# Patient Record
Sex: Female | Born: 1970
Health system: Southern US, Community
[De-identification: ages and names within clinical notes are randomized; demographics above are authoritative.]

## PROBLEM LIST (undated history)

## (undated) DIAGNOSIS — C801 Malignant (primary) neoplasm, unspecified: Secondary | ICD-10-CM

## (undated) HISTORY — PX: TONSILLECTOMY: SUR1361

---

## 2016-04-16 DIAGNOSIS — L539 Erythematous condition, unspecified: Secondary | ICD-10-CM | POA: Diagnosis not present

## 2016-05-29 DIAGNOSIS — Z0000 Encounter for general adult medical examination without abnormal findings: Secondary | ICD-10-CM | POA: Diagnosis not present

## 2016-05-30 DIAGNOSIS — Z0000 Encounter for general adult medical examination without abnormal findings: Secondary | ICD-10-CM | POA: Diagnosis not present

## 2016-06-13 DIAGNOSIS — J029 Acute pharyngitis, unspecified: Secondary | ICD-10-CM | POA: Diagnosis not present

## 2016-09-30 DIAGNOSIS — E7800 Pure hypercholesterolemia, unspecified: Secondary | ICD-10-CM | POA: Diagnosis not present

## 2016-12-03 ENCOUNTER — Other Ambulatory Visit: Payer: Self-pay | Admitting: Family Medicine

## 2016-12-03 DIAGNOSIS — Z01419 Encounter for gynecological examination (general) (routine) without abnormal findings: Secondary | ICD-10-CM | POA: Diagnosis not present

## 2016-12-03 DIAGNOSIS — Z1231 Encounter for screening mammogram for malignant neoplasm of breast: Secondary | ICD-10-CM | POA: Diagnosis not present

## 2016-12-17 ENCOUNTER — Ambulatory Visit
Admission: RE | Admit: 2016-12-17 | Discharge: 2016-12-17 | Disposition: A | Discharge status: Home / Self Care | Payer: 59 | Source: Ambulatory Visit | Attending: Family Medicine | Admitting: Family Medicine

## 2016-12-17 ENCOUNTER — Other Ambulatory Visit: Payer: Self-pay | Admitting: Family Medicine

## 2016-12-17 DIAGNOSIS — Z1231 Encounter for screening mammogram for malignant neoplasm of breast: Secondary | ICD-10-CM

## 2016-12-19 DIAGNOSIS — E7800 Pure hypercholesterolemia, unspecified: Secondary | ICD-10-CM | POA: Diagnosis not present

## 2016-12-19 DIAGNOSIS — J069 Acute upper respiratory infection, unspecified: Secondary | ICD-10-CM | POA: Diagnosis not present

## 2017-04-21 DIAGNOSIS — E7800 Pure hypercholesterolemia, unspecified: Secondary | ICD-10-CM | POA: Diagnosis not present

## 2017-06-10 DIAGNOSIS — Z0000 Encounter for general adult medical examination without abnormal findings: Secondary | ICD-10-CM | POA: Diagnosis not present

## 2017-12-04 ENCOUNTER — Other Ambulatory Visit: Payer: Self-pay | Admitting: Obstetrics & Gynecology

## 2017-12-04 DIAGNOSIS — Z0000 Encounter for general adult medical examination without abnormal findings: Secondary | ICD-10-CM | POA: Diagnosis not present

## 2017-12-04 DIAGNOSIS — Z01419 Encounter for gynecological examination (general) (routine) without abnormal findings: Secondary | ICD-10-CM | POA: Diagnosis not present

## 2017-12-04 DIAGNOSIS — Z1231 Encounter for screening mammogram for malignant neoplasm of breast: Secondary | ICD-10-CM

## 2017-12-11 DIAGNOSIS — E7800 Pure hypercholesterolemia, unspecified: Secondary | ICD-10-CM | POA: Diagnosis not present

## 2017-12-11 DIAGNOSIS — M7521 Bicipital tendinitis, right shoulder: Secondary | ICD-10-CM | POA: Diagnosis not present

## 2017-12-18 ENCOUNTER — Ambulatory Visit
Admission: RE | Admit: 2017-12-18 | Discharge: 2017-12-18 | Disposition: A | Discharge status: Home / Self Care | Payer: 59 | Source: Ambulatory Visit | Attending: Obstetrics & Gynecology | Admitting: Obstetrics & Gynecology

## 2017-12-18 DIAGNOSIS — Z1231 Encounter for screening mammogram for malignant neoplasm of breast: Secondary | ICD-10-CM | POA: Diagnosis present

## 2017-12-18 HISTORY — DX: Malignant (primary) neoplasm, unspecified: C80.1

## 2018-01-21 DIAGNOSIS — R3129 Other microscopic hematuria: Secondary | ICD-10-CM | POA: Diagnosis not present

## 2018-01-21 DIAGNOSIS — M545 Low back pain: Secondary | ICD-10-CM | POA: Diagnosis not present

## 2018-01-21 DIAGNOSIS — R300 Dysuria: Secondary | ICD-10-CM | POA: Diagnosis not present

## 2018-01-21 DIAGNOSIS — R350 Frequency of micturition: Secondary | ICD-10-CM | POA: Diagnosis not present

## 2018-02-12 DIAGNOSIS — H524 Presbyopia: Secondary | ICD-10-CM | POA: Diagnosis not present

## 2018-04-13 DIAGNOSIS — J029 Acute pharyngitis, unspecified: Secondary | ICD-10-CM | POA: Diagnosis not present

## 2018-04-13 DIAGNOSIS — B373 Candidiasis of vulva and vagina: Secondary | ICD-10-CM | POA: Diagnosis not present

## 2018-04-13 DIAGNOSIS — R6889 Other general symptoms and signs: Secondary | ICD-10-CM | POA: Diagnosis not present

## 2018-04-13 DIAGNOSIS — J209 Acute bronchitis, unspecified: Secondary | ICD-10-CM | POA: Diagnosis not present

## 2018-04-13 DIAGNOSIS — J019 Acute sinusitis, unspecified: Secondary | ICD-10-CM | POA: Diagnosis not present

## 2018-04-15 DIAGNOSIS — E7800 Pure hypercholesterolemia, unspecified: Secondary | ICD-10-CM | POA: Diagnosis not present

## 2018-04-16 DIAGNOSIS — J181 Lobar pneumonia, unspecified organism: Secondary | ICD-10-CM | POA: Diagnosis not present

## 2018-04-22 DIAGNOSIS — J019 Acute sinusitis, unspecified: Secondary | ICD-10-CM | POA: Diagnosis not present

## 2018-04-22 DIAGNOSIS — E7800 Pure hypercholesterolemia, unspecified: Secondary | ICD-10-CM | POA: Diagnosis not present

## 2018-04-22 DIAGNOSIS — E6609 Other obesity due to excess calories: Secondary | ICD-10-CM | POA: Diagnosis not present

## 2018-05-03 DIAGNOSIS — R05 Cough: Secondary | ICD-10-CM | POA: Diagnosis not present

## 2018-05-03 DIAGNOSIS — J40 Bronchitis, not specified as acute or chronic: Secondary | ICD-10-CM | POA: Diagnosis not present

## 2018-05-10 DIAGNOSIS — J209 Acute bronchitis, unspecified: Secondary | ICD-10-CM | POA: Diagnosis not present

## 2018-05-10 DIAGNOSIS — J45998 Other asthma: Secondary | ICD-10-CM | POA: Diagnosis not present

## 2018-05-10 DIAGNOSIS — J019 Acute sinusitis, unspecified: Secondary | ICD-10-CM | POA: Diagnosis not present

## 2018-05-27 DIAGNOSIS — Z23 Encounter for immunization: Secondary | ICD-10-CM | POA: Diagnosis not present

## 2018-06-04 DIAGNOSIS — D2262 Melanocytic nevi of left upper limb, including shoulder: Secondary | ICD-10-CM | POA: Diagnosis not present

## 2018-06-04 DIAGNOSIS — Z85820 Personal history of malignant melanoma of skin: Secondary | ICD-10-CM | POA: Diagnosis not present

## 2018-06-04 DIAGNOSIS — D2261 Melanocytic nevi of right upper limb, including shoulder: Secondary | ICD-10-CM | POA: Diagnosis not present

## 2018-06-17 DIAGNOSIS — J019 Acute sinusitis, unspecified: Secondary | ICD-10-CM | POA: Diagnosis not present

## 2018-06-17 DIAGNOSIS — B9689 Other specified bacterial agents as the cause of diseases classified elsewhere: Secondary | ICD-10-CM | POA: Diagnosis not present

## 2018-08-20 DIAGNOSIS — Z0000 Encounter for general adult medical examination without abnormal findings: Secondary | ICD-10-CM | POA: Diagnosis not present

## 2018-08-20 DIAGNOSIS — E7800 Pure hypercholesterolemia, unspecified: Secondary | ICD-10-CM | POA: Diagnosis not present

## 2018-08-27 DIAGNOSIS — E7800 Pure hypercholesterolemia, unspecified: Secondary | ICD-10-CM | POA: Diagnosis not present

## 2018-08-27 DIAGNOSIS — R030 Elevated blood-pressure reading, without diagnosis of hypertension: Secondary | ICD-10-CM | POA: Diagnosis not present

## 2018-08-27 DIAGNOSIS — Z0000 Encounter for general adult medical examination without abnormal findings: Secondary | ICD-10-CM | POA: Diagnosis not present

## 2019-02-10 ENCOUNTER — Other Ambulatory Visit: Payer: Self-pay

## 2019-02-10 ENCOUNTER — Encounter: Payer: Self-pay | Admitting: Emergency Medicine

## 2019-02-10 ENCOUNTER — Emergency Department: Payer: 59

## 2019-02-10 DIAGNOSIS — T50905A Adverse effect of unspecified drugs, medicaments and biological substances, initial encounter: Secondary | ICD-10-CM | POA: Diagnosis not present

## 2019-02-10 DIAGNOSIS — R519 Headache, unspecified: Secondary | ICD-10-CM | POA: Diagnosis present

## 2019-02-10 DIAGNOSIS — R232 Flushing: Secondary | ICD-10-CM | POA: Diagnosis not present

## 2019-02-10 DIAGNOSIS — T887XXA Unspecified adverse effect of drug or medicament, initial encounter: Secondary | ICD-10-CM | POA: Diagnosis not present

## 2019-02-10 DIAGNOSIS — J0100 Acute maxillary sinusitis, unspecified: Secondary | ICD-10-CM | POA: Insufficient documentation

## 2019-02-10 DIAGNOSIS — Y658 Other specified misadventures during surgical and medical care: Secondary | ICD-10-CM | POA: Insufficient documentation

## 2019-02-10 LAB — URINALYSIS, COMPLETE (UACMP) WITH MICROSCOPIC
Bacteria, UA: NONE SEEN
Bilirubin Urine: NEGATIVE
Glucose, UA: NEGATIVE mg/dL
Ketones, ur: NEGATIVE mg/dL
Leukocytes,Ua: NEGATIVE
Nitrite: NEGATIVE
Protein, ur: NEGATIVE mg/dL
Specific Gravity, Urine: 1.012 (ref 1.005–1.030)
pH: 6 (ref 5.0–8.0)

## 2019-02-10 LAB — CBC WITH DIFFERENTIAL/PLATELET
Abs Immature Granulocytes: 0.04 10*3/uL (ref 0.00–0.07)
Basophils Absolute: 0.1 10*3/uL (ref 0.0–0.1)
Basophils Relative: 1 %
Eosinophils Absolute: 0.2 10*3/uL (ref 0.0–0.5)
Eosinophils Relative: 1 %
HCT: 44.6 % (ref 36.0–46.0)
Hemoglobin: 15.2 g/dL — ABNORMAL HIGH (ref 12.0–15.0)
Immature Granulocytes: 0 %
Lymphocytes Relative: 18 %
Lymphs Abs: 2.2 10*3/uL (ref 0.7–4.0)
MCH: 30.5 pg (ref 26.0–34.0)
MCHC: 34.1 g/dL (ref 30.0–36.0)
MCV: 89.6 fL (ref 80.0–100.0)
Monocytes Absolute: 0.9 10*3/uL (ref 0.1–1.0)
Monocytes Relative: 7 %
Neutro Abs: 9.3 10*3/uL — ABNORMAL HIGH (ref 1.7–7.7)
Neutrophils Relative %: 73 %
Platelets: 313 10*3/uL (ref 150–400)
RBC: 4.98 MIL/uL (ref 3.87–5.11)
RDW: 11.9 % (ref 11.5–15.5)
WBC: 12.7 10*3/uL — ABNORMAL HIGH (ref 4.0–10.5)
nRBC: 0 % (ref 0.0–0.2)

## 2019-02-10 LAB — COMPREHENSIVE METABOLIC PANEL WITH GFR
ALT: 32 U/L (ref 0–44)
AST: 30 U/L (ref 15–41)
Albumin: 4.7 g/dL (ref 3.5–5.0)
Alkaline Phosphatase: 73 U/L (ref 38–126)
Anion gap: 11 (ref 5–15)
BUN: 11 mg/dL (ref 6–20)
CO2: 24 mmol/L (ref 22–32)
Calcium: 9.3 mg/dL (ref 8.9–10.3)
Chloride: 103 mmol/L (ref 98–111)
Creatinine, Ser: 0.9 mg/dL (ref 0.44–1.00)
GFR calc Af Amer: >60 mL/min (ref >60)
GFR calc non Af Amer: >60 mL/min (ref >60)
Glucose, Bld: 150 mg/dL — ABNORMAL HIGH (ref 70–99)
Potassium: 3.2 mmol/L — ABNORMAL LOW (ref 3.5–5.1)
Sodium: 138 mmol/L (ref 135–145)
Total Bilirubin: 0.7 mg/dL (ref 0.3–1.2)
Total Protein: 8.3 g/dL — ABNORMAL HIGH (ref 6.5–8.1)

## 2019-02-10 LAB — LIPASE, BLOOD: Lipase: 31 U/L (ref 11–51)

## 2019-02-10 LAB — POCT PREGNANCY, URINE: Preg Test, Ur: NEGATIVE

## 2019-02-10 NOTE — ED Triage Notes (Addendum)
Patient ambulatory to triage with steady gait, without difficulty or distress noted, mask in place; pt st "I felt a warm sensation come over my body like my skin is hot with nausea"; denies pain or other accomp symptoms; st has recently been taking mucinex for sinus pressure and congestion

## 2019-02-11 ENCOUNTER — Emergency Department
Admission: EM | Admit: 2019-02-11 | Discharge: 2019-02-11 | Disposition: A | Discharge status: Home / Self Care | Payer: 59 | Attending: Emergency Medicine | Admitting: Emergency Medicine

## 2019-02-11 DIAGNOSIS — J0100 Acute maxillary sinusitis, unspecified: Secondary | ICD-10-CM

## 2019-02-11 DIAGNOSIS — T50905A Adverse effect of unspecified drugs, medicaments and biological substances, initial encounter: Secondary | ICD-10-CM

## 2019-02-11 MED ORDER — AMOXICILLIN-POT CLAVULANATE 875-125 MG PO TABS: 1.0000 | ORAL_TABLET | Freq: Two times a day (BID) | ORAL | 0 refills | Status: AC

## 2019-02-11 MED ORDER — DIPHENHYDRAMINE HCL 25 MG PO CAPS
50.0000 mg | ORAL_CAPSULE | Freq: Once | ORAL | Status: AC
  Administered 2019-02-11: 50 mg via ORAL

## 2019-02-11 MED ORDER — DIPHENHYDRAMINE HCL 25 MG PO CAPS
ORAL_CAPSULE | ORAL | Status: AC
  Filled 2019-02-11: qty 2

## 2019-02-11 NOTE — ED Provider Notes (Signed)
[facility] Emergency Department Provider Note    First MD Initiated Contact with Patient 02/11/19 [phone]     (approximate)  I have reviewed the triage vital signs and the nursing notes.   HISTORY  Chief Complaint Nausea   HPI Whitney Schmitt is a 48 y.o. female with below list of previous medical conditions presents to the emergency department secondary to warm sensation all over her body, skin felt hot and subsequent nausea after taking Xyzal.  Patient was seen by her primary care provider and advised to take Xyzal and Mucinex Zyrtec and Flonase secondary to allergies.  Patient states since her appointment she has had bilateral facial pain upper tooth pain.  Patient denies any fever afebrile on presentation.  Patient states that symptoms have been occurring since last Thursday.       Past Medical History:  Diagnosis Date  . Cancer ([place])    skin    There are no active problems to display for this patient.   Past Surgical History:  Procedure Laterality Date  . TONSILLECTOMY      Prior to Admission medications   Medication Sig Start Date End Date Taking? Authorizing Provider  amoxicillin-clavulanate (AUGMENTIN) 875-125 MG tablet Take 1 tablet by mouth 2 (two) times daily for 10 days. 02/11/19 02/21/19  Gregor Hams, MD    Allergies Patient has no known allergies.  No family history on file.  Social History Social History   Tobacco Use  . Smoking status: Never Smoker  . Smokeless tobacco: Never Used  Substance Use Topics  . Alcohol use: Not on file  . Drug use: Not on file    Review of Systems Constitutional: No fever/chills Eyes: No visual changes. ENT: No sore throat.  Positive for facial pain Cardiovascular: Denies chest pain. Respiratory: Denies shortness of breath. Gastrointestinal: No abdominal pain.  No nausea, no vomiting.  No diarrhea.  No constipation. Genitourinary: Negative for dysuria. Musculoskeletal: Negative for  neck pain.  Negative for back pain. Integumentary: Negative for rash.  Positive for hot sensation to the skin Neurological: Negative for headaches, focal weakness or numbness.   ____________________________________________   PHYSICAL EXAM:  VITAL SIGNS: ED Triage Vitals [02/10/19 2022]  Enc Vitals Group     BP (!) 169/100     Pulse Rate (!) 118     Resp 20     Temp 97.8 F (36.6 C)     Temp Source Oral     SpO2 100 %     Weight 102.1 kg (225 lb)     Height 1.727 m (5\' 8" )     Head Circumference      Peak Flow      Pain Score 0     Pain Loc      Pain Edu?      Excl. in [place]?     Constitutional: Alert and oriented.  Eyes: Conjunctivae are normal.  Mouth/Throat: Patient is wearing a mask. Neck: No stridor.  No meningeal signs.   Cardiovascular: Normal rate, regular rhythm. Good peripheral circulation. Grossly normal heart sounds. Respiratory: Normal respiratory effort.  No retractions. Gastrointestinal: Soft and nontender. No distention.  Musculoskeletal: No lower extremity tenderness nor edema. No gross deformities of extremities. Neurologic:  Normal speech and language. No gross focal neurologic deficits are appreciated.  Skin:  Skin is warm, dry and intact.  Flushing of the thoracic and skin on the neck.   Psychiatric: Mood and affect are normal. Speech and behavior are normal.  ____________________________________________   LABS (all labs ordered are listed, but only abnormal results are displayed)  Labs Reviewed  CBC WITH DIFFERENTIAL/PLATELET - Abnormal; Notable for the following components:      Result Value   WBC 12.7 (*)    Hemoglobin 15.2 (*)    Neutro Abs 9.3 (*)    All other components within normal limits  COMPREHENSIVE METABOLIC PANEL - Abnormal; Notable for the following components:   Potassium 3.2 (*)    Glucose, Bld 150 (*)    Total Protein 8.3 (*)    All other components within normal limits  URINALYSIS, COMPLETE (UACMP) WITH MICROSCOPIC -  Abnormal; Notable for the following components:   Color, Urine YELLOW (*)    APPearance CLEAR (*)    Hgb urine dipstick SMALL (*)    All other components within normal limits  LIPASE, BLOOD  POCT PREGNANCY, URINE    RADIOLOGY I, [place] Ronny Korff, personally viewed and evaluated these images (plain radiographs) as part of my medical decision making, as well as reviewing the written report by the radiologist.  ED MD interpretation: No acute cardiopulmonary abnormality.  Official radiology report(s): Dg Chest 2 View  Result Date: 02/10/2019 CLINICAL DATA:  Congestion EXAM: CHEST - 2 VIEW COMPARISON:  None FINDINGS: No consolidation, features of edema, pneumothorax, or effusion. Pulmonary vascularity is normally distributed. The cardiomediastinal contours are unremarkable. No acute osseous or soft tissue abnormality. IMPRESSION: No acute cardiopulmonary abnormality. Electronically Signed   By: Lovena Le M.D.   On: 02/10/2019 23:57     Procedures   ____________________________________________   INITIAL IMPRESSION / MDM / [place] / ED COURSE  As part of my medical decision making, I reviewed the following data within the electronic MEDICAL RECORD NUMBER  48 year old female presenting with above-stated history and physical exam concerning for possible adverse reaction to Xyzal and sinusitis.  Patient given Benadryl 50 mg in the emergency department improvement of symptoms.  Patient will also be prescribed Augmentin for home with recommendation to follow-up with primary care provider.  ____________________________________________  FINAL CLINICAL IMPRESSION(S) / ED DIAGNOSES  Final diagnoses:  Acute maxillary sinusitis, recurrence not specified  Medication reaction, initial encounter     MEDICATIONS GIVEN DURING THIS VISIT:  Medications  diphenhydrAMINE (BENADRYL) capsule 50 mg (50 mg Oral Given 02/11/19 0113)     ED Discharge Orders         Ordered     amoxicillin-clavulanate (AUGMENTIN) 875-125 MG tablet  2 times daily     02/11/19 0149          *Please note:  Whitney Schmitt was evaluated in Emergency Department on 02/11/2019 for the symptoms described in the history of present illness. She was evaluated in the context of the global COVID-19 pandemic, which necessitated consideration that the patient might be at risk for infection with the SARS-CoV-2 virus that causes COVID-19. Institutional protocols and algorithms that pertain to the evaluation of patients at risk for COVID-19 are in a state of rapid change based on information released by regulatory bodies including the CDC and federal and state organizations. These policies and algorithms were followed during the patient's care in the ED.  Some ED evaluations and interventions may be delayed as a result of limited staffing during the pandemic.*  Note:  This document was prepared using Dragon voice recognition software and may include unintentional dictation errors.   Gregor Hams, MD 02/11/19 [phone]

## 2019-09-15 ENCOUNTER — Other Ambulatory Visit: Payer: Self-pay | Admitting: Obstetrics & Gynecology

## 2019-09-15 DIAGNOSIS — Z1231 Encounter for screening mammogram for malignant neoplasm of breast: Secondary | ICD-10-CM

## 2019-10-19 ENCOUNTER — Ambulatory Visit
Admission: RE | Admit: 2019-10-19 | Discharge: 2019-10-19 | Disposition: A | Discharge status: Home / Self Care | Payer: 59 | Source: Ambulatory Visit | Attending: Obstetrics & Gynecology | Admitting: Obstetrics & Gynecology

## 2019-10-19 ENCOUNTER — Encounter: Payer: Self-pay | Admitting: Radiology

## 2019-10-19 DIAGNOSIS — Z1231 Encounter for screening mammogram for malignant neoplasm of breast: Secondary | ICD-10-CM | POA: Insufficient documentation

## 2020-06-07 ENCOUNTER — Ambulatory Visit: Admit: 2020-06-07 | Payer: 59 | Admitting: Unknown Physician Specialty

## 2020-06-07 SURGERY — SINUS SURGERY, WITH IMAGING GUIDANCE
Anesthesia: General | Laterality: Bilateral

## 2020-09-14 ENCOUNTER — Other Ambulatory Visit: Payer: Self-pay | Admitting: Family Medicine

## 2020-09-14 DIAGNOSIS — Z1231 Encounter for screening mammogram for malignant neoplasm of breast: Secondary | ICD-10-CM

## 2020-10-19 ENCOUNTER — Ambulatory Visit
Admission: RE | Admit: 2020-10-19 | Discharge: 2020-10-19 | Disposition: A | Discharge status: Home / Self Care | Payer: 59 | Source: Ambulatory Visit | Attending: Family Medicine | Admitting: Family Medicine

## 2020-10-19 ENCOUNTER — Other Ambulatory Visit: Payer: Self-pay

## 2020-10-19 DIAGNOSIS — Z1231 Encounter for screening mammogram for malignant neoplasm of breast: Secondary | ICD-10-CM | POA: Diagnosis not present

## 2021-09-10 ENCOUNTER — Other Ambulatory Visit: Payer: Self-pay | Admitting: Certified Nurse Midwife

## 2021-09-10 DIAGNOSIS — Z1231 Encounter for screening mammogram for malignant neoplasm of breast: Secondary | ICD-10-CM

## 2021-10-29 ENCOUNTER — Ambulatory Visit
Admission: RE | Admit: 2021-10-29 | Discharge: 2021-10-29 | Disposition: A | Discharge status: Home / Self Care | Payer: 59 | Source: Ambulatory Visit | Attending: Certified Nurse Midwife | Admitting: Certified Nurse Midwife

## 2021-10-29 DIAGNOSIS — Z1231 Encounter for screening mammogram for malignant neoplasm of breast: Secondary | ICD-10-CM | POA: Insufficient documentation

## 2022-09-19 ENCOUNTER — Other Ambulatory Visit: Payer: Self-pay

## 2022-09-19 DIAGNOSIS — Z1231 Encounter for screening mammogram for malignant neoplasm of breast: Secondary | ICD-10-CM

## 2022-09-30 IMAGING — MG MM DIGITAL SCREENING BILAT W/ TOMO AND CAD
8 of 14 series · 8 of 40 positions shown · non-contrast
Comparison: Previous exam(s).

CLINICAL DATA: Screening.

EXAM:
DIGITAL SCREENING BILATERAL MAMMOGRAM WITH TOMOSYNTHESIS AND CAD
TECHNIQUE: Bilateral screening digital craniocaudal and mediolateral oblique
mammograms were obtained. Bilateral screening digital breast
tomosynthesis was performed. The images were evaluated with
computer-aided detection.

[L MLO synth-2D (1 of 2)]
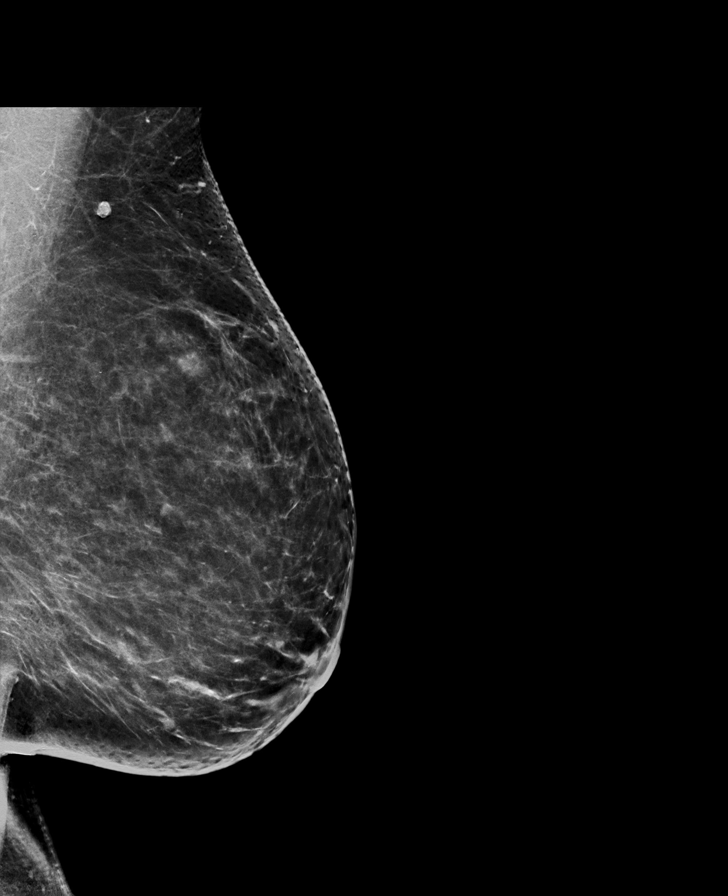

[R MLO synth-2D]
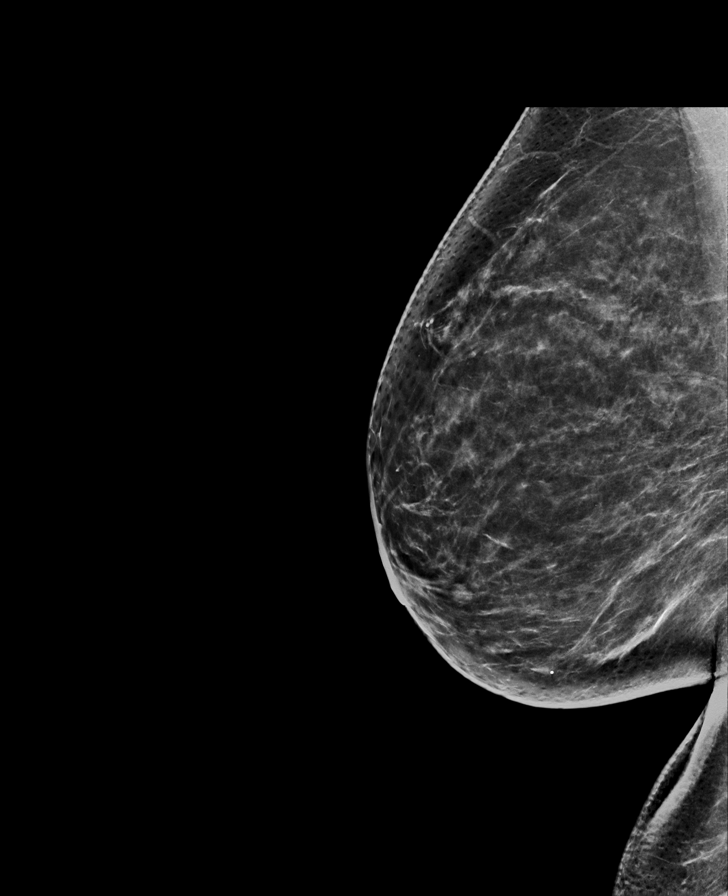

[L CC synth-2D (1 of 2)]
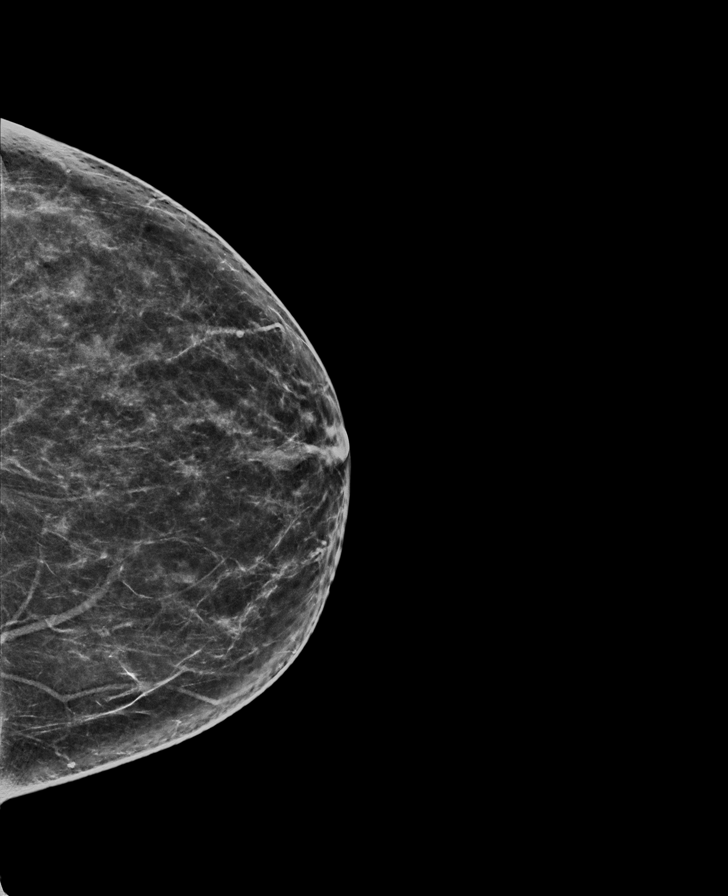

[L MLO synth-2D (2 of 2)]
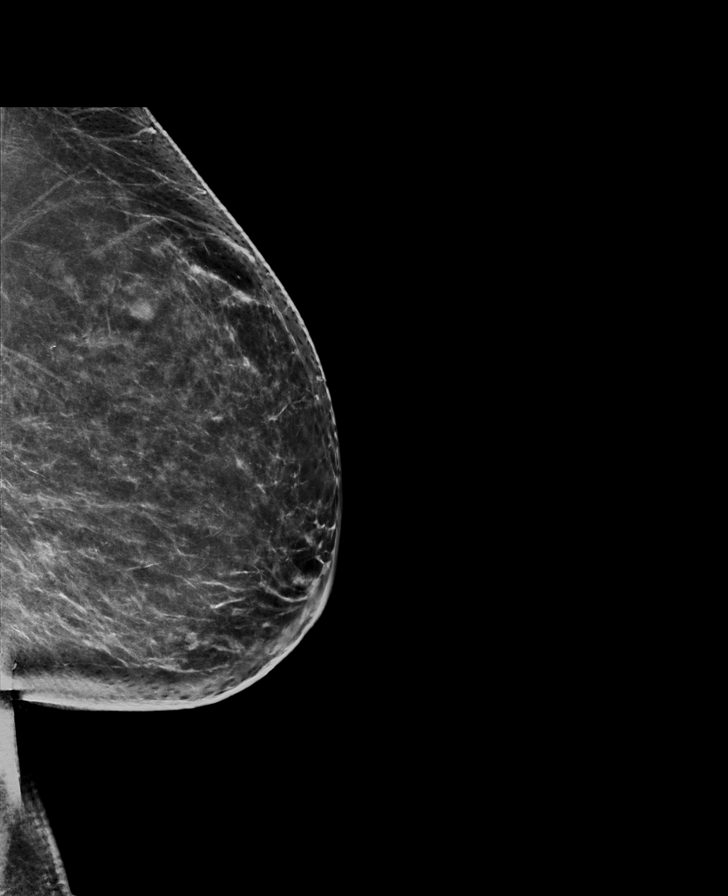

[R CC synth-2D (1 of 2)]
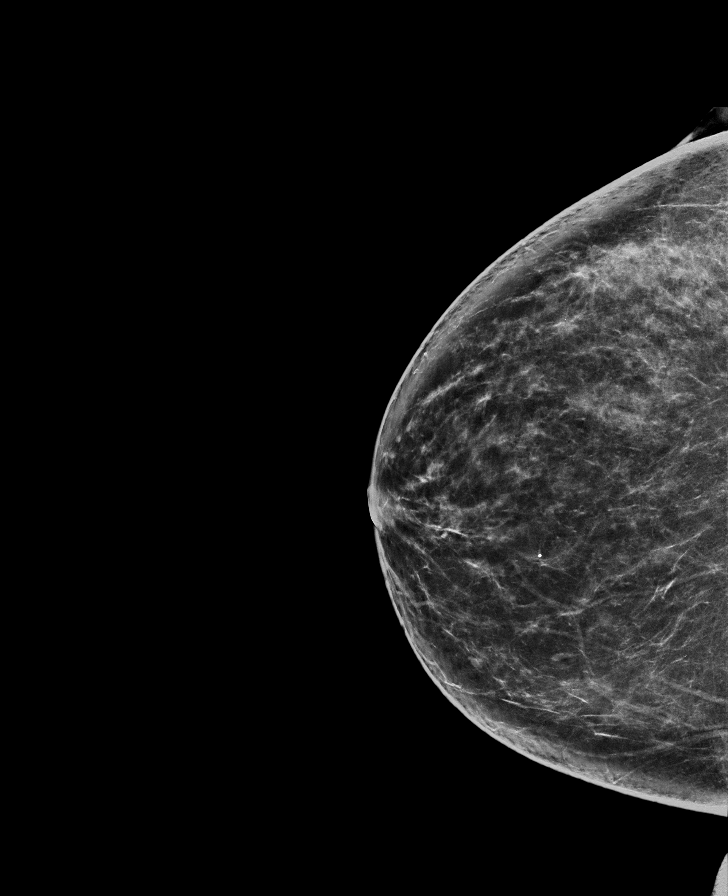

[R CC synth-2D (2 of 2)]
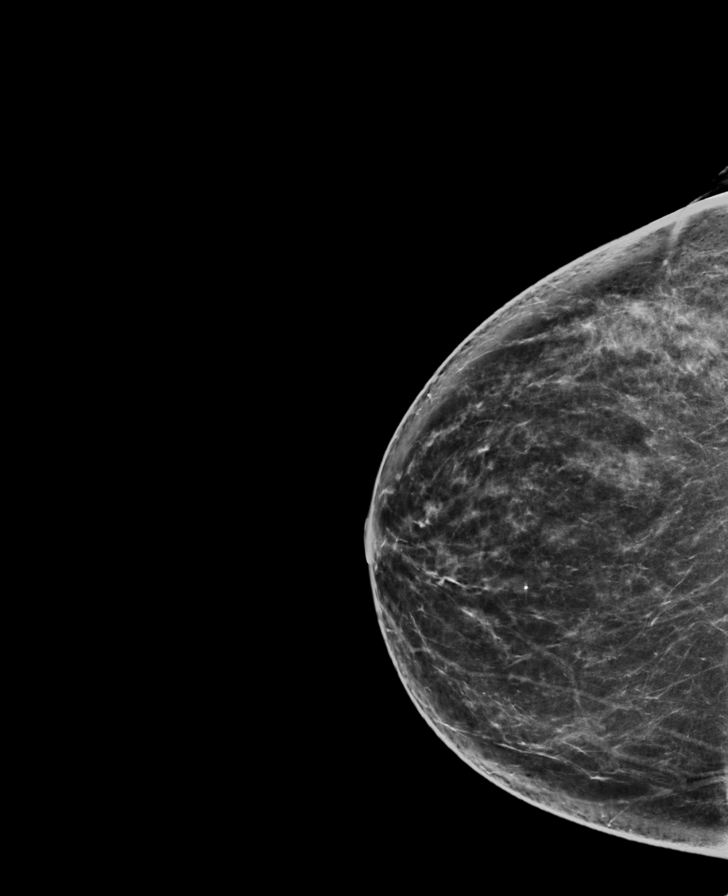

[L CC synth-2D (2 of 2)]
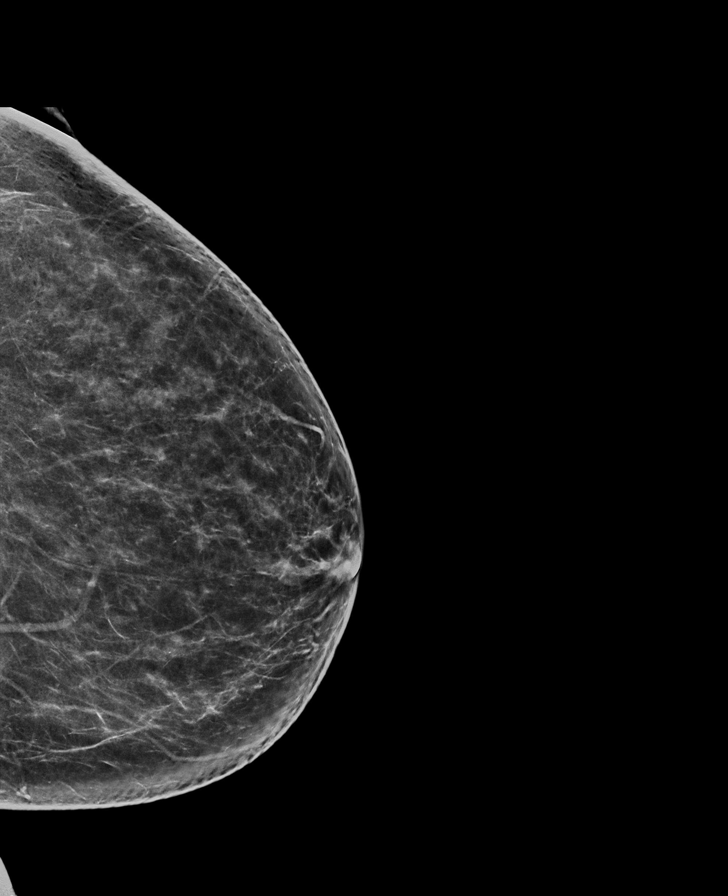

[L MLO tomo · tomo slice 42/83.0]
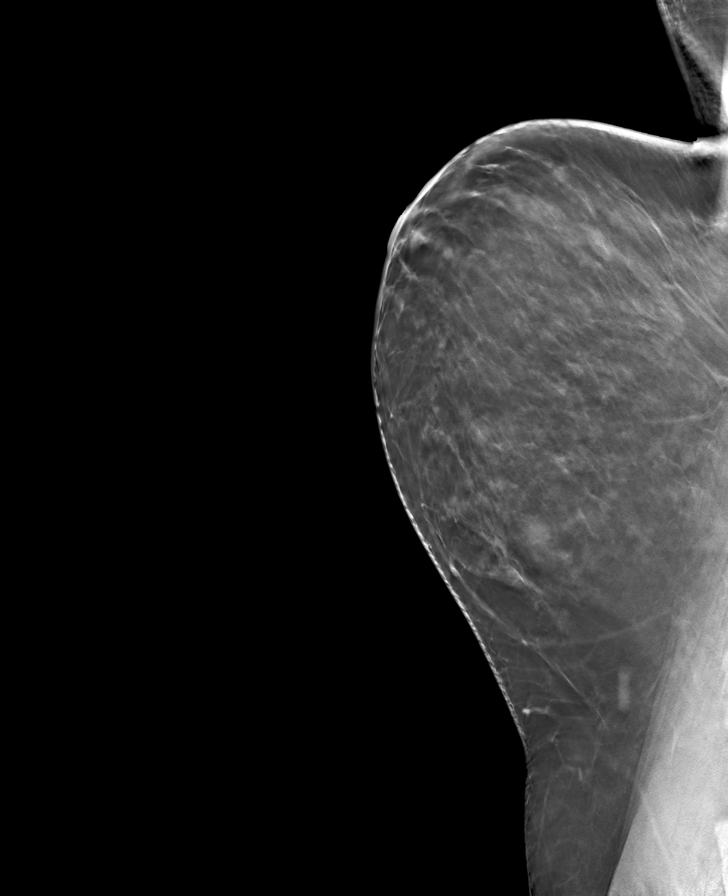

[8 of 40 positions shown; findings below may reference images not displayed]

ACR Breast Density Category b: There are scattered areas of
fibroglandular density.
FINDINGS: There are no findings suspicious for malignancy.
IMPRESSION: No mammographic evidence of malignancy. A result letter of this
screening mammogram will be mailed directly to the patient.

RECOMMENDATION:
Screening mammogram in one year. (Code:51-O-LD2)

BI-RADS CATEGORY  1: Negative.

## 2022-11-04 ENCOUNTER — Ambulatory Visit
Admission: RE | Admit: 2022-11-04 | Discharge: 2022-11-04 | Disposition: A | Discharge status: Home / Self Care | Payer: 59 | Source: Ambulatory Visit | Attending: Obstetrics and Gynecology | Admitting: Obstetrics and Gynecology

## 2022-11-04 DIAGNOSIS — Z1231 Encounter for screening mammogram for malignant neoplasm of breast: Secondary | ICD-10-CM | POA: Diagnosis present

## 2023-09-01 ENCOUNTER — Emergency Department

## 2023-09-01 ENCOUNTER — Emergency Department
Admission: EM | Admit: 2023-09-01 | Discharge: 2023-09-01 | Disposition: A | Discharge status: Home / Self Care | Attending: Emergency Medicine | Admitting: Emergency Medicine

## 2023-09-01 DIAGNOSIS — J189 Pneumonia, unspecified organism: Secondary | ICD-10-CM

## 2023-09-01 DIAGNOSIS — R55 Syncope and collapse: Secondary | ICD-10-CM

## 2023-09-01 DIAGNOSIS — R509 Fever, unspecified: Secondary | ICD-10-CM | POA: Diagnosis present

## 2023-09-01 DIAGNOSIS — J181 Lobar pneumonia, unspecified organism: Secondary | ICD-10-CM | POA: Insufficient documentation

## 2023-09-01 LAB — COMPREHENSIVE METABOLIC PANEL WITH GFR
ALT: 15 U/L (ref 0–44)
AST: 16 U/L (ref 15–41)
Albumin: 4.8 g/dL (ref 3.5–5.0)
Alkaline Phosphatase: 60 U/L (ref 38–126)
Anion gap: 11 (ref 5–15)
BUN: 17 mg/dL (ref 6–20)
CO2: 22 mmol/L (ref 22–32)
Calcium: 9.2 mg/dL (ref 8.9–10.3)
Chloride: 105 mmol/L (ref 98–111)
Creatinine, Ser: 1.17 mg/dL — ABNORMAL HIGH (ref 0.44–1.00)
GFR, Estimated: 56 mL/min — ABNORMAL LOW (ref >60)
Glucose, Bld: 148 mg/dL — ABNORMAL HIGH (ref 70–99)
Potassium: 3.8 mmol/L (ref 3.5–5.1)
Sodium: 138 mmol/L (ref 135–145)
Total Bilirubin: 0.9 mg/dL (ref 0.0–1.2)
Total Protein: 8.4 g/dL — ABNORMAL HIGH (ref 6.5–8.1)

## 2023-09-01 LAB — CBC WITH DIFFERENTIAL/PLATELET
Abs Immature Granulocytes: 0.02 10*3/uL (ref 0.00–0.07)
Basophils Absolute: 0 10*3/uL (ref 0.0–0.1)
Basophils Relative: 1 %
Eosinophils Absolute: 0 10*3/uL (ref 0.0–0.5)
Eosinophils Relative: 0 %
HCT: 46.1 % — ABNORMAL HIGH (ref 36.0–46.0)
Hemoglobin: 16.1 g/dL — ABNORMAL HIGH (ref 12.0–15.0)
Immature Granulocytes: 0 %
Lymphocytes Relative: 17 %
Lymphs Abs: 1.4 10*3/uL (ref 0.7–4.0)
MCH: 30.9 pg (ref 26.0–34.0)
MCHC: 34.9 g/dL (ref 30.0–36.0)
MCV: 88.5 fL (ref 80.0–100.0)
Monocytes Absolute: 0.6 10*3/uL (ref 0.1–1.0)
Monocytes Relative: 7 %
Neutro Abs: 6.3 10*3/uL (ref 1.7–7.7)
Neutrophils Relative %: 75 %
Platelets: 248 10*3/uL (ref 150–400)
RBC: 5.21 MIL/uL — ABNORMAL HIGH (ref 3.87–5.11)
RDW: 11.9 % (ref 11.5–15.5)
WBC: 8.3 10*3/uL (ref 4.0–10.5)
nRBC: 0 % (ref 0.0–0.2)

## 2023-09-01 LAB — TROPONIN I (HIGH SENSITIVITY): Troponin I (High Sensitivity): 13 ng/L (ref <18)

## 2023-09-01 MED ORDER — AMOXICILLIN 500 MG PO CAPS: 1000.0000 mg | ORAL_CAPSULE | Freq: Three times a day (TID) | ORAL | 0 refills | Status: AC

## 2023-09-01 MED ORDER — SODIUM CHLORIDE 0.9 % IV BOLUS
1000.0000 mL | Freq: Once | INTRAVENOUS | Status: AC
  Administered 2023-09-01: 1000 mL via INTRAVENOUS

## 2023-09-01 NOTE — ED Triage Notes (Signed)
 Pt via POV from [facility]. Pt c/o just not feeling right. Pt states she just feels really clammy. Reports that she is getting over a cold, states had a fever this AM. KC reports that her HR increased when she got up to walk. Denies pain. On arrival, pt BP 75/33, pt skin is clammy. A&Ox4 and NAD

## 2023-09-01 NOTE — ED Provider Notes (Signed)
 [facility] Provider Note    Event Date/Time   First MD Initiated Contact with Patient 09/01/23 [phone]     (approximate)   History   Chief Complaint Near Syncope   HPI  Whitney Schmitt is a 53 y.o. female with no significant past medical history who presents to the ED complaining of near syncope.  Patient reports that last night she developed a fever of 100.8 with some sinus pressure and congestion as well as a dry cough.  She presented to the walk-in [facility] clinic earlier today, but while there became lightheaded and dizzy, subsequently noted to be hypotensive.  She denies any associated chest pain or shortness of breath, dizziness improved since patient was brought to the ED.  She denies any fevers today and has not had any nausea, vomiting, diarrhea, or dysuria.     Physical Exam   Triage Vital Signs: ED Triage Vitals  Encounter Vitals Group     BP 09/01/23 0942 (!) 75/53     Systolic BP Percentile --      Diastolic BP Percentile --      Pulse Rate 09/01/23 0942 68     Resp 09/01/23 0942 16     Temp 09/01/23 0942 98.4 F (36.9 C)     Temp Source 09/01/23 0942 Oral     SpO2 09/01/23 0942 99 %     Weight 09/01/23 0938 230 lb (104.3 kg)     Height 09/01/23 0938 5\' 8"  (1.727 m)     Head Circumference --      Peak Flow --      Pain Score 09/01/23 0938 0     Pain Loc --      Pain Education --      Exclude from Growth Chart --     Most recent vital signs: Vitals:   09/01/23 1030 09/01/23 1100  BP: 115/80 128/78  Pulse: 91 94  Resp: 18 18  Temp:    SpO2: 97% 100%    Constitutional: Alert and oriented. Eyes: Conjunctivae are normal. Head: Atraumatic. Nose: No congestion/rhinnorhea. Mouth/Throat: Mucous membranes are moist.  Cardiovascular: Normal rate, regular rhythm. Grossly normal heart sounds.  2+ radial pulses bilaterally. Respiratory: Normal respiratory effort.  No retractions. Lungs CTAB. Gastrointestinal: Soft and nontender. No  distention. Musculoskeletal: No lower extremity tenderness nor edema.  Neurologic:  Normal speech and language. No gross focal neurologic deficits are appreciated.    ED Results / Procedures / Treatments   Labs (all labs ordered are listed, but only abnormal results are displayed) Labs Reviewed  CBC WITH DIFFERENTIAL/PLATELET - Abnormal; Notable for the following components:      Result Value   RBC 5.21 (*)    Hemoglobin 16.1 (*)    HCT 46.1 (*)    All other components within normal limits  COMPREHENSIVE METABOLIC PANEL WITH GFR - Abnormal; Notable for the following components:   Glucose, Bld 148 (*)    Creatinine, Ser 1.17 (*)    Total Protein 8.4 (*)    GFR, Estimated 56 (*)    All other components within normal limits  POC URINE PREG, ED  TROPONIN I (HIGH SENSITIVITY)     EKG  ED ECG REPORT I, Twilla Galea, the attending physician, personally viewed and interpreted this ECG.   Date: 09/01/2023  EKG Time: 9:41  Rate: 76  Rhythm: normal sinus rhythm  Axis: Normal  Intervals:left posterior fascicular block  ST&T Change: None  RADIOLOGY Chest x-ray reviewed and interpreted by  me with left lower lobe infiltrate, no edema or effusion noted.  PROCEDURES:  Critical Care performed: No  Procedures   MEDICATIONS ORDERED IN ED: Medications  sodium chloride 0.9 % bolus 1,000 mL (1,000 mLs Intravenous New Bag/Given 09/01/23 1020)     IMPRESSION / MDM / ASSESSMENT AND PLAN / ED COURSE  I reviewed the triage vital signs and the nursing notes.                              53 y.o. female with no significant past medical history who presents to the ED complaining of fever and sinus pressure since yesterday, developed lightheadedness and hypotension after presenting to the walk-in clinic.  Patient's presentation is most consistent with acute presentation with potential threat to life or bodily function.  Differential diagnosis includes, but is not limited to, sepsis,  arrhythmia, ACS, anemia, electrolyte abnormality, AKI, sinusitis, bronchitis, pneumonia.  Patient nontoxic-appearing and in no acute distress, vital signs initially remarkable for hypotension, quickly improved following initiation of IV fluid bolus.  EKG shows no evidence of arrhythmia or ischemia, suspect vasovagal episode rather than cardiac etiology.  Given otherwise reassuring vital signs, low suspicion for sepsis.  Viral testing was performed at the walk-in clinic, will also check chest x-ray.  Chest x-ray concerning for left lower lobe pneumonia, labs are reassuring with no significant anemia, leukocytosis, lactate abnormality, or AKI.  BP remained stable following IV fluid bolus, no findings concerning for sepsis and patient appropriate for outpatient management with antibiotics.  She was counseled to follow-up with her PCP and return to the ED for new worsening symptoms, patient agrees with plan.      FINAL CLINICAL IMPRESSION(S) / ED DIAGNOSES   Final diagnoses:  Pneumonia of left lower lobe due to infectious organism  Near syncope     Rx / DC Orders   ED Discharge Orders          Ordered    amoxicillin  (AMOXIL ) 500 MG capsule  3 times daily        09/01/23 1212             Note:  This document was prepared using Dragon voice recognition software and may include unintentional dictation errors.   Twilla Galea, MD 09/01/23 [phone]

## 2023-09-16 ENCOUNTER — Other Ambulatory Visit
Admission: RE | Admit: 2023-09-16 | Discharge: 2023-09-16 | Disposition: A | Discharge status: Home / Self Care | Source: Ambulatory Visit | Attending: Physician Assistant | Admitting: Physician Assistant

## 2023-09-16 ENCOUNTER — Other Ambulatory Visit: Payer: Self-pay | Admitting: Physician Assistant

## 2023-09-16 ENCOUNTER — Ambulatory Visit
Admission: RE | Admit: 2023-09-16 | Discharge: 2023-09-16 | Disposition: A | Discharge status: Home / Self Care | Source: Ambulatory Visit | Attending: Physician Assistant | Admitting: Physician Assistant

## 2023-09-16 DIAGNOSIS — R7989 Other specified abnormal findings of blood chemistry: Secondary | ICD-10-CM

## 2023-09-16 DIAGNOSIS — J189 Pneumonia, unspecified organism: Secondary | ICD-10-CM | POA: Diagnosis not present

## 2023-09-16 LAB — D-DIMER, QUANTITATIVE: D-Dimer, Quant: 0.51 ug{FEU}/mL — ABNORMAL HIGH (ref 0.00–0.50)

## 2023-09-16 MED ORDER — IOPAMIDOL (ISOVUE-370) INJECTION 76%
75.0000 mL | Freq: Once | INTRAVENOUS | Status: AC | PRN
  Administered 2023-09-16: 75 mL via INTRAVENOUS

## 2023-09-18 ENCOUNTER — Other Ambulatory Visit: Payer: Self-pay | Admitting: Physician Assistant

## 2023-09-18 DIAGNOSIS — K7689 Other specified diseases of liver: Secondary | ICD-10-CM

## 2023-09-18 DIAGNOSIS — R160 Hepatomegaly, not elsewhere classified: Secondary | ICD-10-CM

## 2023-09-19 ENCOUNTER — Other Ambulatory Visit: Payer: Self-pay | Admitting: Physician Assistant

## 2023-09-19 DIAGNOSIS — J189 Pneumonia, unspecified organism: Secondary | ICD-10-CM

## 2023-09-19 DIAGNOSIS — R7989 Other specified abnormal findings of blood chemistry: Secondary | ICD-10-CM

## 2023-09-30 ENCOUNTER — Other Ambulatory Visit: Payer: Self-pay | Admitting: Physician Assistant

## 2023-09-30 DIAGNOSIS — R160 Hepatomegaly, not elsewhere classified: Secondary | ICD-10-CM

## 2023-09-30 DIAGNOSIS — K7689 Other specified diseases of liver: Secondary | ICD-10-CM

## 2023-09-30 DIAGNOSIS — E041 Nontoxic single thyroid nodule: Secondary | ICD-10-CM

## 2023-10-07 ENCOUNTER — Ambulatory Visit
Admission: RE | Admit: 2023-10-07 | Discharge: 2023-10-07 | Disposition: A | Discharge status: Home / Self Care | Source: Ambulatory Visit | Attending: Physician Assistant | Admitting: Physician Assistant

## 2023-10-07 DIAGNOSIS — E041 Nontoxic single thyroid nodule: Secondary | ICD-10-CM

## 2023-10-07 DIAGNOSIS — K7689 Other specified diseases of liver: Secondary | ICD-10-CM

## 2023-10-07 DIAGNOSIS — R160 Hepatomegaly, not elsewhere classified: Secondary | ICD-10-CM

## 2023-10-07 MED ORDER — GADOPICLENOL 0.5 MMOL/ML IV SOLN
10.0000 mL | Freq: Once | INTRAVENOUS | Status: AC | PRN
  Administered 2023-10-07: 10 mL via INTRAVENOUS

## 2023-10-20 ENCOUNTER — Other Ambulatory Visit: Payer: Self-pay | Admitting: Family Medicine

## 2023-10-20 DIAGNOSIS — Z1231 Encounter for screening mammogram for malignant neoplasm of breast: Secondary | ICD-10-CM

## 2023-11-05 ENCOUNTER — Ambulatory Visit
Admission: RE | Admit: 2023-11-05 | Discharge: 2023-11-05 | Disposition: A | Discharge status: Home / Self Care | Source: Ambulatory Visit | Attending: Family Medicine | Admitting: Family Medicine

## 2023-11-05 DIAGNOSIS — Z1231 Encounter for screening mammogram for malignant neoplasm of breast: Secondary | ICD-10-CM | POA: Diagnosis present
